# Patient Record
Sex: Male | Born: 1945 | Race: White | Hispanic: No | Marital: Married | Smoking: Never smoker
Health system: Southern US, Community
[De-identification: ages and names within clinical notes are randomized; demographics above are authoritative.]

## PROBLEM LIST (undated history)

## (undated) DIAGNOSIS — D693 Immune thrombocytopenic purpura: Secondary | ICD-10-CM

## (undated) DIAGNOSIS — E079 Disorder of thyroid, unspecified: Secondary | ICD-10-CM

## (undated) HISTORY — DX: Immune thrombocytopenic purpura: D69.3

## (undated) HISTORY — PX: HERNIA REPAIR: SHX51

## (undated) HISTORY — DX: Disorder of thyroid, unspecified: E07.9

---

## 2001-07-28 ENCOUNTER — Emergency Department (HOSPITAL_COMMUNITY): Admission: EM | Admit: 2001-07-28 | Discharge: 2001-07-28 | Payer: Self-pay | Admitting: Emergency Medicine

## 2001-07-28 ENCOUNTER — Encounter: Payer: Self-pay | Admitting: Orthopedic Surgery

## 2001-07-28 ENCOUNTER — Encounter: Payer: Self-pay | Admitting: Emergency Medicine

## 2006-03-18 DIAGNOSIS — D693 Immune thrombocytopenic purpura: Secondary | ICD-10-CM

## 2006-03-18 HISTORY — DX: Immune thrombocytopenic purpura: D69.3

## 2006-03-22 ENCOUNTER — Inpatient Hospital Stay (HOSPITAL_COMMUNITY): Admission: EM | Admit: 2006-03-22 | Discharge: 2006-03-25 | Payer: Self-pay | Admitting: Emergency Medicine

## 2006-03-24 ENCOUNTER — Ambulatory Visit: Payer: Self-pay | Admitting: Oncology

## 2006-03-27 LAB — CBC & DIFF AND RETIC
BASO%: 0.4 % (ref 0.0–2.0)
EOS%: 0.1 % (ref 0.0–7.0)
HGB: 14.9 g/dL (ref 13.0–17.1)
IRF: 0.41 — ABNORMAL HIGH (ref 0.070–0.380)
MCH: 29.7 pg (ref 28.0–33.4)
MCHC: 35 g/dL (ref 32.0–35.9)
MONO#: 0.6 10*3/uL (ref 0.1–0.9)
RDW: 13.9 % (ref 11.2–14.6)
RETIC #: 122.2 10*3/uL — ABNORMAL HIGH (ref 31.8–103.9)
WBC: 8.8 10*3/uL (ref 4.0–10.0)
lymph#: 1.3 10*3/uL (ref 0.9–3.3)

## 2006-03-27 LAB — COMPREHENSIVE METABOLIC PANEL
Albumin: 4.4 g/dL (ref 3.5–5.2)
BUN: 31 mg/dL — ABNORMAL HIGH (ref 6–23)
CO2: 26 mEq/L (ref 19–32)
Calcium: 9 mg/dL (ref 8.4–10.5)
Chloride: 106 mEq/L (ref 96–112)
Glucose, Bld: 93 mg/dL (ref 70–99)
Potassium: 3.7 mEq/L (ref 3.5–5.3)

## 2006-03-30 ENCOUNTER — Encounter (INDEPENDENT_AMBULATORY_CARE_PROVIDER_SITE_OTHER): Payer: Self-pay | Admitting: *Deleted

## 2006-03-30 ENCOUNTER — Encounter: Admission: RE | Admit: 2006-03-30 | Discharge: 2006-03-30 | Payer: Self-pay | Admitting: Internal Medicine

## 2006-03-30 ENCOUNTER — Other Ambulatory Visit: Admission: RE | Admit: 2006-03-30 | Discharge: 2006-03-30 | Payer: Self-pay | Admitting: Interventional Radiology

## 2006-04-03 LAB — CBC WITH DIFFERENTIAL/PLATELET
BASO%: 0.4 % (ref 0.0–2.0)
EOS%: 0.1 % (ref 0.0–7.0)
MCH: 29.8 pg (ref 28.0–33.4)
MCHC: 34.5 g/dL (ref 32.0–35.9)
MCV: 86.4 fL (ref 81.6–98.0)
MONO%: 3.2 % (ref 0.0–13.0)
NEUT#: 9.8 10*3/uL — ABNORMAL HIGH (ref 1.5–6.5)
RBC: 5.1 10*6/uL (ref 4.20–5.71)
RDW: 14.3 % (ref 11.2–14.6)

## 2006-04-03 LAB — MORPHOLOGY

## 2006-04-10 LAB — CBC WITH DIFFERENTIAL/PLATELET
BASO%: 0.6 % (ref 0.0–2.0)
Eosinophils Absolute: 0 10*3/uL (ref 0.0–0.5)
HCT: 41.9 % (ref 38.7–49.9)
LYMPH%: 7.3 % — ABNORMAL LOW (ref 14.0–48.0)
MCHC: 34.8 g/dL (ref 32.0–35.9)
MCV: 86.3 fL (ref 81.6–98.0)
MONO%: 5.6 % (ref 0.0–13.0)
NEUT%: 86.5 % — ABNORMAL HIGH (ref 40.0–75.0)
Platelets: 178 10*3/uL (ref 145–400)
RBC: 4.85 10*6/uL (ref 4.20–5.71)

## 2006-04-10 LAB — MORPHOLOGY: RBC Comments: NORMAL

## 2006-05-01 LAB — CBC WITH DIFFERENTIAL/PLATELET
BASO%: 0.3 % (ref 0.0–2.0)
EOS%: 0.1 % (ref 0.0–7.0)
HCT: 40.7 % (ref 38.7–49.9)
MCH: 29.8 pg (ref 28.0–33.4)
MCHC: 34.3 g/dL (ref 32.0–35.9)
NEUT%: 78 % — ABNORMAL HIGH (ref 40.0–75.0)
RBC: 4.68 10*6/uL (ref 4.20–5.71)
WBC: 7 10*3/uL (ref 4.0–10.0)
lymph#: 1.1 10*3/uL (ref 0.9–3.3)

## 2006-05-11 ENCOUNTER — Ambulatory Visit: Payer: Self-pay | Admitting: Oncology

## 2006-05-22 LAB — CBC WITH DIFFERENTIAL/PLATELET
BASO%: 0.4 % (ref 0.0–2.0)
EOS%: 0.9 % (ref 0.0–7.0)
HCT: 41.4 % (ref 38.7–49.9)
HGB: 14.2 g/dL (ref 13.0–17.1)
MCHC: 34.3 g/dL (ref 32.0–35.9)
MONO#: 0.4 10*3/uL (ref 0.1–0.9)
NEUT%: 61 % (ref 40.0–75.0)
RDW: 14.6 % (ref 11.2–14.6)
WBC: 5.3 10*3/uL (ref 4.0–10.0)
lymph#: 1.6 10*3/uL (ref 0.9–3.3)

## 2006-05-29 LAB — CBC WITH DIFFERENTIAL/PLATELET
Basophils Absolute: 0 10*3/uL (ref 0.0–0.1)
Eosinophils Absolute: 0.2 10*3/uL (ref 0.0–0.5)
HCT: 40.2 % (ref 38.7–49.9)
HGB: 13.9 g/dL (ref 13.0–17.1)
MCH: 29.9 pg (ref 28.0–33.4)
MONO#: 0.7 10*3/uL (ref 0.1–0.9)
NEUT#: 4.4 10*3/uL (ref 1.5–6.5)
NEUT%: 63.4 % (ref 40.0–75.0)
RDW: 14.3 % (ref 11.2–14.6)
WBC: 7 10*3/uL (ref 4.0–10.0)
lymph#: 1.7 10*3/uL (ref 0.9–3.3)

## 2006-06-05 LAB — CBC WITH DIFFERENTIAL/PLATELET
Basophils Absolute: 0 10*3/uL (ref 0.0–0.1)
EOS%: 1.8 % (ref 0.0–7.0)
Eosinophils Absolute: 0.1 10*3/uL (ref 0.0–0.5)
HCT: 40.4 % (ref 38.7–49.9)
HGB: 13.9 g/dL (ref 13.0–17.1)
MCH: 29.4 pg (ref 28.0–33.4)
MCV: 85.3 fL (ref 81.6–98.0)
MONO%: 7.2 % (ref 0.0–13.0)
NEUT#: 3.4 10*3/uL (ref 1.5–6.5)
NEUT%: 61.3 % (ref 40.0–75.0)
Platelets: 222 10*3/uL (ref 145–400)
RDW: 14.1 % (ref 11.2–14.6)

## 2006-06-05 LAB — MORPHOLOGY: PLT EST: ADEQUATE

## 2006-06-12 LAB — CBC WITH DIFFERENTIAL/PLATELET
Basophils Absolute: 0 10*3/uL (ref 0.0–0.1)
Eosinophils Absolute: 0.1 10*3/uL (ref 0.0–0.5)
HCT: 42.9 % (ref 38.7–49.9)
HGB: 14.6 g/dL (ref 13.0–17.1)
MCH: 29.3 pg (ref 28.0–33.4)
MCV: 86.4 fL (ref 81.6–98.0)
MONO%: 6.9 % (ref 0.0–13.0)
NEUT#: 3.6 10*3/uL (ref 1.5–6.5)
NEUT%: 63 % (ref 40.0–75.0)
RDW: 14.4 % (ref 11.2–14.6)
lymph#: 1.5 10*3/uL (ref 0.9–3.3)

## 2006-06-12 LAB — MORPHOLOGY
PLT EST: ADEQUATE
RBC Comments: NORMAL

## 2006-06-26 ENCOUNTER — Ambulatory Visit: Payer: Self-pay | Admitting: Oncology

## 2006-06-27 LAB — CBC WITH DIFFERENTIAL/PLATELET
Basophils Absolute: 0 10*3/uL (ref 0.0–0.1)
Eosinophils Absolute: 0.2 10*3/uL (ref 0.0–0.5)
HCT: 39.1 % (ref 38.7–49.9)
HGB: 13.5 g/dL (ref 13.0–17.1)
MONO#: 0.4 10*3/uL (ref 0.1–0.9)
NEUT#: 2.7 10*3/uL (ref 1.5–6.5)
NEUT%: 54.1 % (ref 40.0–75.0)
WBC: 4.9 10*3/uL (ref 4.0–10.0)
lymph#: 1.7 10*3/uL (ref 0.9–3.3)

## 2006-06-27 LAB — MORPHOLOGY: PLT EST: ADEQUATE

## 2006-07-14 LAB — CBC WITH DIFFERENTIAL/PLATELET
BASO%: 0.5 % (ref 0.0–2.0)
Basophils Absolute: 0 10*3/uL (ref 0.0–0.1)
HCT: 42.2 % (ref 38.7–49.9)
HGB: 14.5 g/dL (ref 13.0–17.1)
MONO#: 0.4 10*3/uL (ref 0.1–0.9)
NEUT%: 59.2 % (ref 40.0–75.0)
RDW: 13.3 % (ref 11.2–14.6)
WBC: 5.9 10*3/uL (ref 4.0–10.0)
lymph#: 1.8 10*3/uL (ref 0.9–3.3)

## 2006-07-14 LAB — MORPHOLOGY: PLT EST: ADEQUATE

## 2006-08-23 ENCOUNTER — Ambulatory Visit: Payer: Self-pay | Admitting: Oncology

## 2006-10-03 LAB — CBC WITH DIFFERENTIAL/PLATELET
Eosinophils Absolute: 0.2 10*3/uL (ref 0.0–0.5)
HCT: 43 % (ref 38.7–49.9)
LYMPH%: 31.1 % (ref 14.0–48.0)
MCV: 82 fL (ref 81.6–98.0)
MONO#: 0.4 10*3/uL (ref 0.1–0.9)
MONO%: 8.2 % (ref 0.0–13.0)
NEUT#: 3.1 10*3/uL (ref 1.5–6.5)
NEUT%: 57.1 % (ref 40.0–75.0)
Platelets: 183 10*3/uL (ref 145–400)
RBC: 5.24 10*6/uL (ref 4.20–5.71)

## 2006-10-23 ENCOUNTER — Ambulatory Visit: Payer: Self-pay | Admitting: Oncology

## 2006-10-26 LAB — MORPHOLOGY

## 2006-10-26 LAB — CBC WITH DIFFERENTIAL/PLATELET
EOS%: 4.7 % (ref 0.0–7.0)
MCH: 29.4 pg (ref 28.0–33.4)
MCHC: 35.9 g/dL (ref 32.0–35.9)
MCV: 82 fL (ref 81.6–98.0)
MONO%: 8.1 % (ref 0.0–13.0)
NEUT#: 2.6 10*3/uL (ref 1.5–6.5)
RBC: 4.66 10*6/uL (ref 4.20–5.71)
RDW: 14.2 % (ref 11.2–14.6)

## 2006-11-30 LAB — CBC WITH DIFFERENTIAL/PLATELET
BASO%: 0.4 % (ref 0.0–2.0)
Eosinophils Absolute: 0.2 10*3/uL (ref 0.0–0.5)
LYMPH%: 34.3 % (ref 14.0–48.0)
MCHC: 35.5 g/dL (ref 32.0–35.9)
MCV: 82.9 fL (ref 81.6–98.0)
MONO%: 7 % (ref 0.0–13.0)
NEUT%: 54.6 % (ref 40.0–75.0)
Platelets: 175 10*3/uL (ref 145–400)
RBC: 4.57 10*6/uL (ref 4.20–5.71)

## 2006-11-30 LAB — MORPHOLOGY: RBC Comments: NORMAL

## 2006-12-26 ENCOUNTER — Ambulatory Visit: Payer: Self-pay | Admitting: Oncology

## 2006-12-28 LAB — CBC WITH DIFFERENTIAL/PLATELET
BASO%: 0.7 % (ref 0.0–2.0)
Basophils Absolute: 0 10*3/uL (ref 0.0–0.1)
HCT: 32.5 % — ABNORMAL LOW (ref 38.7–49.9)
HGB: 11.4 g/dL — ABNORMAL LOW (ref 13.0–17.1)
LYMPH%: 30.5 % (ref 14.0–48.0)
MCHC: 35 g/dL (ref 32.0–35.9)
MONO#: 0.3 10*3/uL (ref 0.1–0.9)
NEUT%: 58.6 % (ref 40.0–75.0)
Platelets: 221 10*3/uL (ref 145–400)
WBC: 4.7 10*3/uL (ref 4.0–10.0)

## 2006-12-28 LAB — MORPHOLOGY: PLT EST: ADEQUATE

## 2007-02-20 ENCOUNTER — Ambulatory Visit: Payer: Self-pay | Admitting: Oncology

## 2007-02-20 LAB — CBC WITH DIFFERENTIAL/PLATELET
Basophils Absolute: 0 10*3/uL (ref 0.0–0.1)
Eosinophils Absolute: 0.2 10*3/uL (ref 0.0–0.5)
HGB: 12.7 g/dL — ABNORMAL LOW (ref 13.0–17.1)
MCV: 78.4 fL — ABNORMAL LOW (ref 81.6–98.0)
MONO#: 0.4 10*3/uL (ref 0.1–0.9)
NEUT#: 2.6 10*3/uL (ref 1.5–6.5)
RBC: 4.67 10*6/uL (ref 4.20–5.71)
RDW: 14 % (ref 11.2–14.6)
WBC: 5 10*3/uL (ref 4.0–10.0)
lymph#: 1.7 10*3/uL (ref 0.9–3.3)

## 2007-02-20 LAB — MORPHOLOGY: PLT EST: ADEQUATE

## 2007-03-28 LAB — COMPREHENSIVE METABOLIC PANEL
ALT: 16 U/L (ref 0–53)
AST: 15 U/L (ref 0–37)
Albumin: 4.3 g/dL (ref 3.5–5.2)
Alkaline Phosphatase: 65 U/L (ref 39–117)
BUN: 21 mg/dL (ref 6–23)
Calcium: 8.9 mg/dL (ref 8.4–10.5)
Chloride: 108 mEq/L (ref 96–112)
Potassium: 4.3 mEq/L (ref 3.5–5.3)

## 2007-03-28 LAB — CBC WITH DIFFERENTIAL/PLATELET
BASO%: 0.6 % (ref 0.0–2.0)
Eosinophils Absolute: 0.1 10*3/uL (ref 0.0–0.5)
LYMPH%: 32.1 % (ref 14.0–48.0)
MCHC: 35.3 g/dL (ref 32.0–35.9)
MONO#: 0.4 10*3/uL (ref 0.1–0.9)
NEUT#: 3.2 10*3/uL (ref 1.5–6.5)
Platelets: 190 10*3/uL (ref 145–400)
RBC: 4.89 10*6/uL (ref 4.20–5.71)
RDW: 16.3 % — ABNORMAL HIGH (ref 11.2–14.6)
WBC: 5.5 10*3/uL (ref 4.0–10.0)

## 2007-03-28 LAB — MORPHOLOGY: PLT EST: ADEQUATE

## 2007-03-28 LAB — CHCC SMEAR

## 2007-06-25 ENCOUNTER — Ambulatory Visit: Payer: Self-pay | Admitting: Oncology

## 2007-09-17 ENCOUNTER — Ambulatory Visit: Payer: Self-pay | Admitting: Oncology

## 2007-10-15 LAB — COMPREHENSIVE METABOLIC PANEL
AST: 18 U/L (ref 0–37)
BUN: 19 mg/dL (ref 6–23)
CO2: 25 mEq/L (ref 19–32)
Calcium: 9.2 mg/dL (ref 8.4–10.5)
Chloride: 111 mEq/L (ref 96–112)
Creatinine, Ser: 1.27 mg/dL (ref 0.40–1.50)
Glucose, Bld: 96 mg/dL (ref 70–99)

## 2007-10-15 LAB — CHCC SMEAR

## 2007-10-15 LAB — CBC WITH DIFFERENTIAL/PLATELET
Basophils Absolute: 0 10*3/uL (ref 0.0–0.1)
EOS%: 3.6 % (ref 0.0–7.0)
HGB: 14.2 g/dL (ref 13.0–17.1)
MCH: 29.7 pg (ref 28.0–33.4)
NEUT#: 3.1 10*3/uL (ref 1.5–6.5)
RDW: 14.2 % (ref 11.2–14.6)
lymph#: 1.6 10*3/uL (ref 0.9–3.3)

## 2007-10-15 LAB — MORPHOLOGY

## 2007-12-11 ENCOUNTER — Ambulatory Visit: Payer: Self-pay | Admitting: Oncology

## 2007-12-13 LAB — CBC WITH DIFFERENTIAL/PLATELET
BASO%: 0.7 % (ref 0.0–2.0)
EOS%: 4.9 % (ref 0.0–7.0)
MCH: 29 pg (ref 28.0–33.4)
MCHC: 34.6 g/dL (ref 32.0–35.9)
RBC: 4.77 10*6/uL (ref 4.20–5.71)
RDW: 13.7 % (ref 11.2–14.6)
lymph#: 1.7 10*3/uL (ref 0.9–3.3)

## 2008-02-05 ENCOUNTER — Ambulatory Visit: Payer: Self-pay | Admitting: Oncology

## 2008-02-07 LAB — CBC WITH DIFFERENTIAL/PLATELET
BASO%: 0.6 % (ref 0.0–2.0)
EOS%: 5.3 % (ref 0.0–7.0)
HCT: 39.9 % (ref 38.7–49.9)
LYMPH%: 32.7 % (ref 14.0–48.0)
MCH: 29.1 pg (ref 28.0–33.4)
MCHC: 34.6 g/dL (ref 32.0–35.9)
NEUT%: 53.7 % (ref 40.0–75.0)
Platelets: 161 10*3/uL (ref 145–400)

## 2008-04-01 ENCOUNTER — Ambulatory Visit: Payer: Self-pay | Admitting: Oncology

## 2008-04-18 LAB — COMPREHENSIVE METABOLIC PANEL
ALT: 19 U/L (ref 0–53)
Albumin: 4.5 g/dL (ref 3.5–5.2)
Alkaline Phosphatase: 65 U/L (ref 39–117)
CO2: 24 mEq/L (ref 19–32)
Potassium: 4.5 mEq/L (ref 3.5–5.3)
Sodium: 140 mEq/L (ref 135–145)
Total Bilirubin: 0.9 mg/dL (ref 0.3–1.2)
Total Protein: 7.1 g/dL (ref 6.0–8.3)

## 2008-04-18 LAB — CBC & DIFF AND RETIC
BASO%: 1.1 % (ref 0.0–2.0)
Eosinophils Absolute: 0.3 10*3/uL (ref 0.0–0.5)
HCT: 44.8 % (ref 38.7–49.9)
HGB: 15.4 g/dL (ref 13.0–17.1)
IRF: 0.29 (ref 0.070–0.380)
MCHC: 34.4 g/dL (ref 32.0–35.9)
MONO#: 0.4 10*3/uL (ref 0.1–0.9)
NEUT#: 3.3 10*3/uL (ref 1.5–6.5)
NEUT%: 62.6 % (ref 40.0–75.0)
Platelets: 147 10*3/uL (ref 145–400)
WBC: 5.2 10*3/uL (ref 4.0–10.0)
lymph#: 1.2 10*3/uL (ref 0.9–3.3)

## 2008-04-18 LAB — CHCC SMEAR

## 2008-04-18 LAB — LACTATE DEHYDROGENASE: LDH: 167 U/L (ref 94–250)

## 2008-08-30 IMAGING — CR DG CHEST 1V SAME DAY
1 series · 1 of 1 positions shown · non-contrast
Comparison: Earlier the same day.

CLINICAL DATA: Chest pain.  Question nodule vs nipple. 
 PORTABLE CHEST - 1 VIEW ? 03/22/06:

[view not recorded]
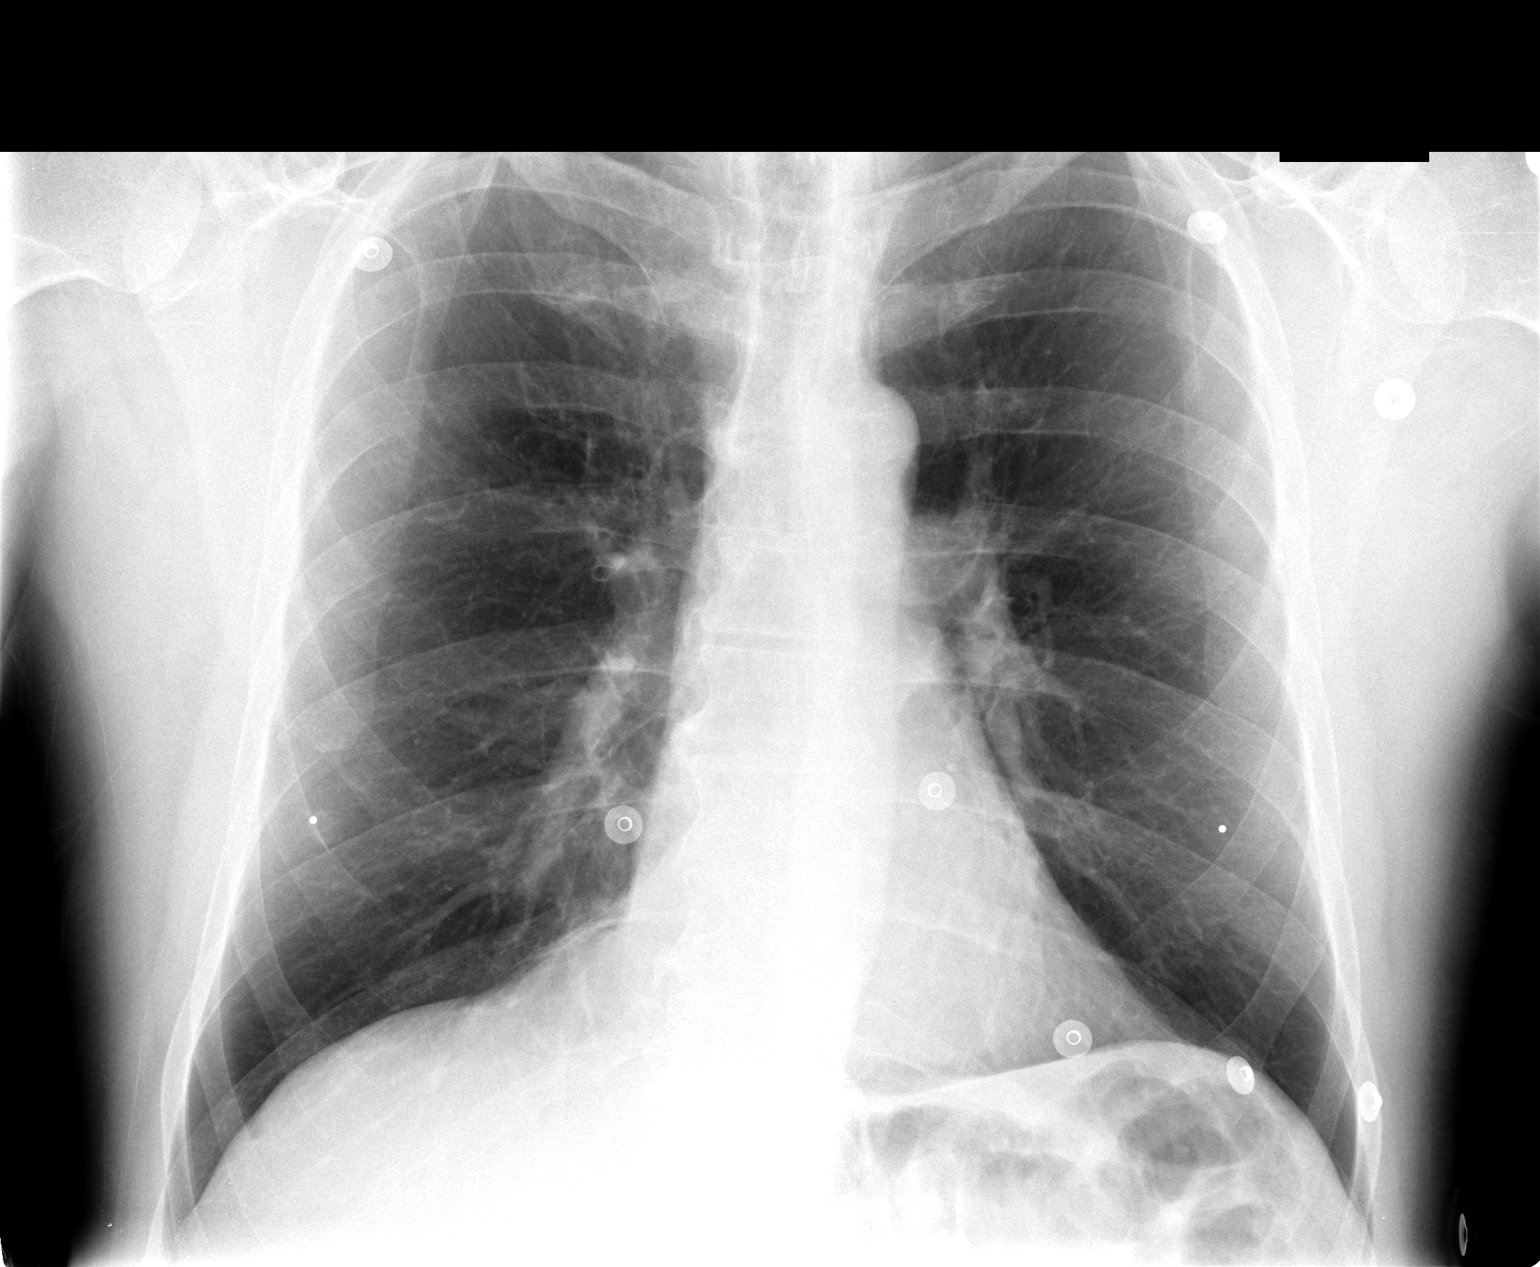

[1 of 1 positions shown; findings below may reference images not displayed]

FINDINGS: Repeat chest radiograph with nipple markers in place demonstrates nodular opacity in the right lower chest as the patient?s nipple.   The lungs are clear.  The chest is hyperexpanded.  Heart size upper normal.
IMPRESSION: Negative for pulmonary nodule in patient with evidence of emphysema.

## 2008-08-30 IMAGING — CR DG CHEST 2V
2 series · 2 of 2 positions shown · non-contrast
Comparison: None.

CLINICAL DATA: CHEST - 2 VIEW:

[view not recorded (1 of 2)]
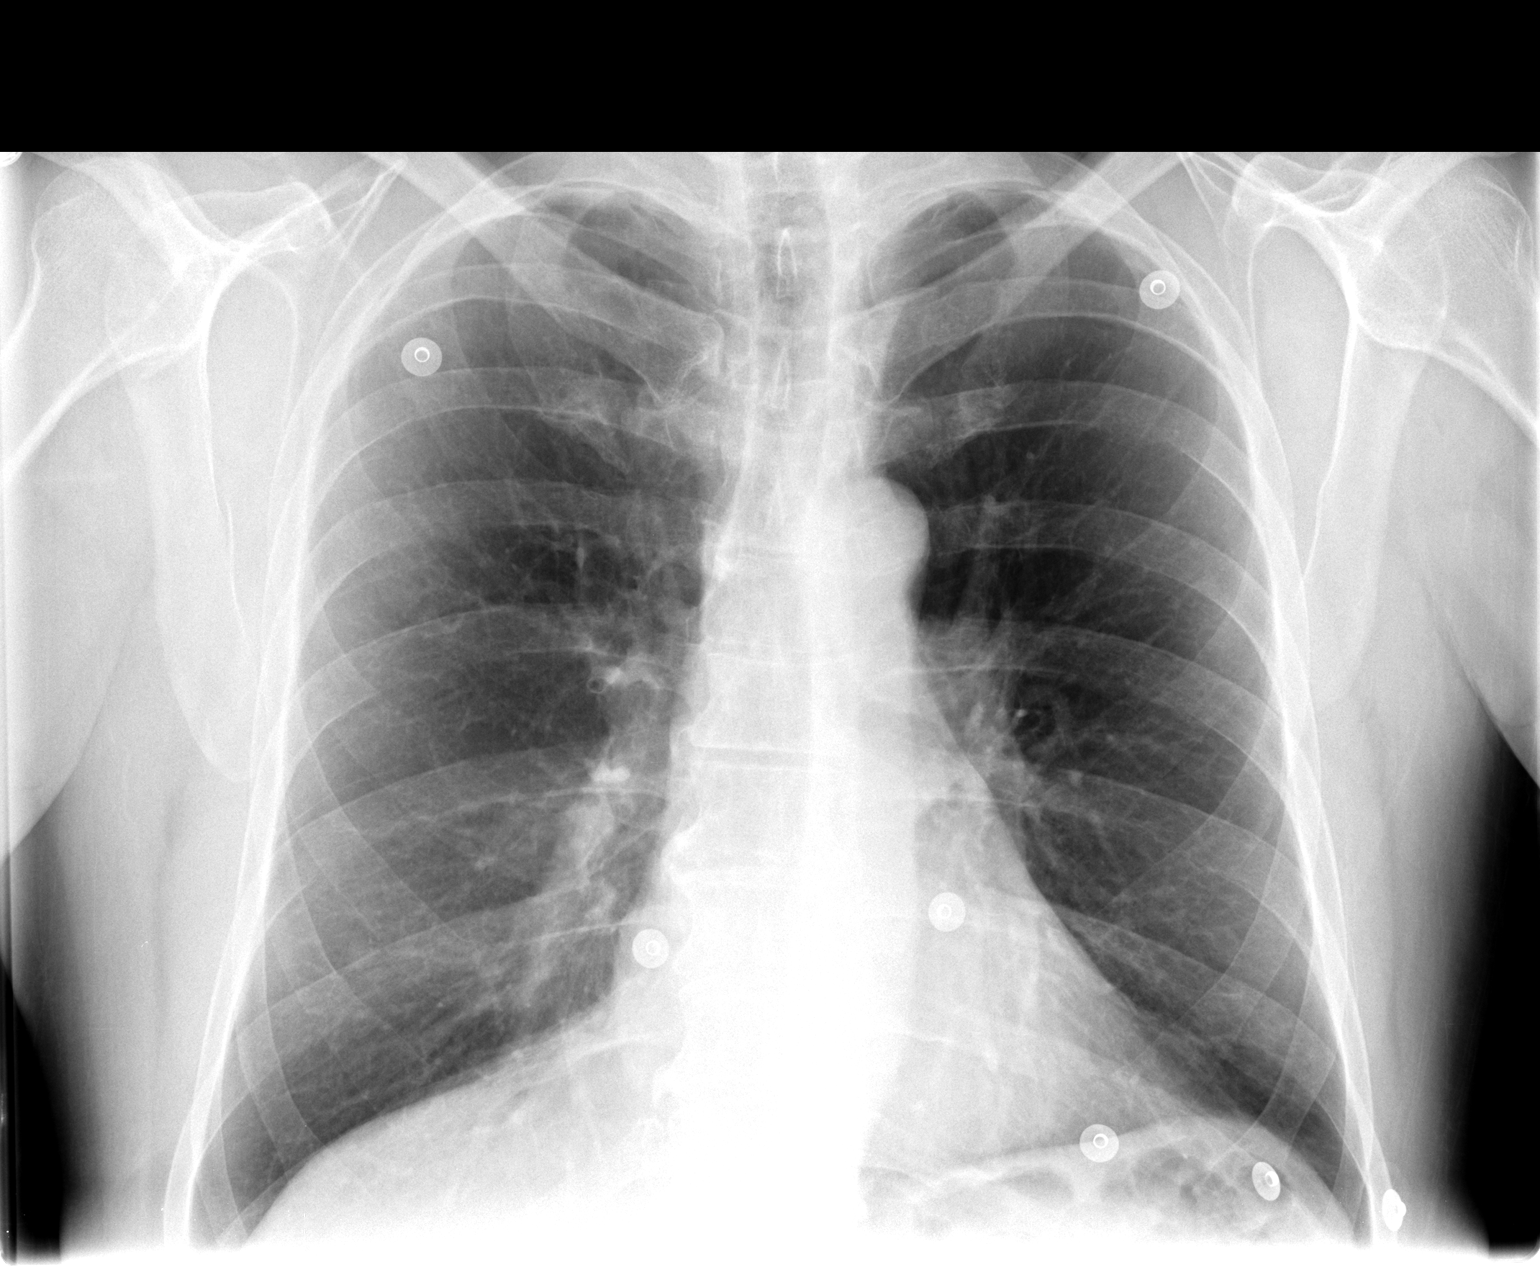

[view not recorded (2 of 2)]
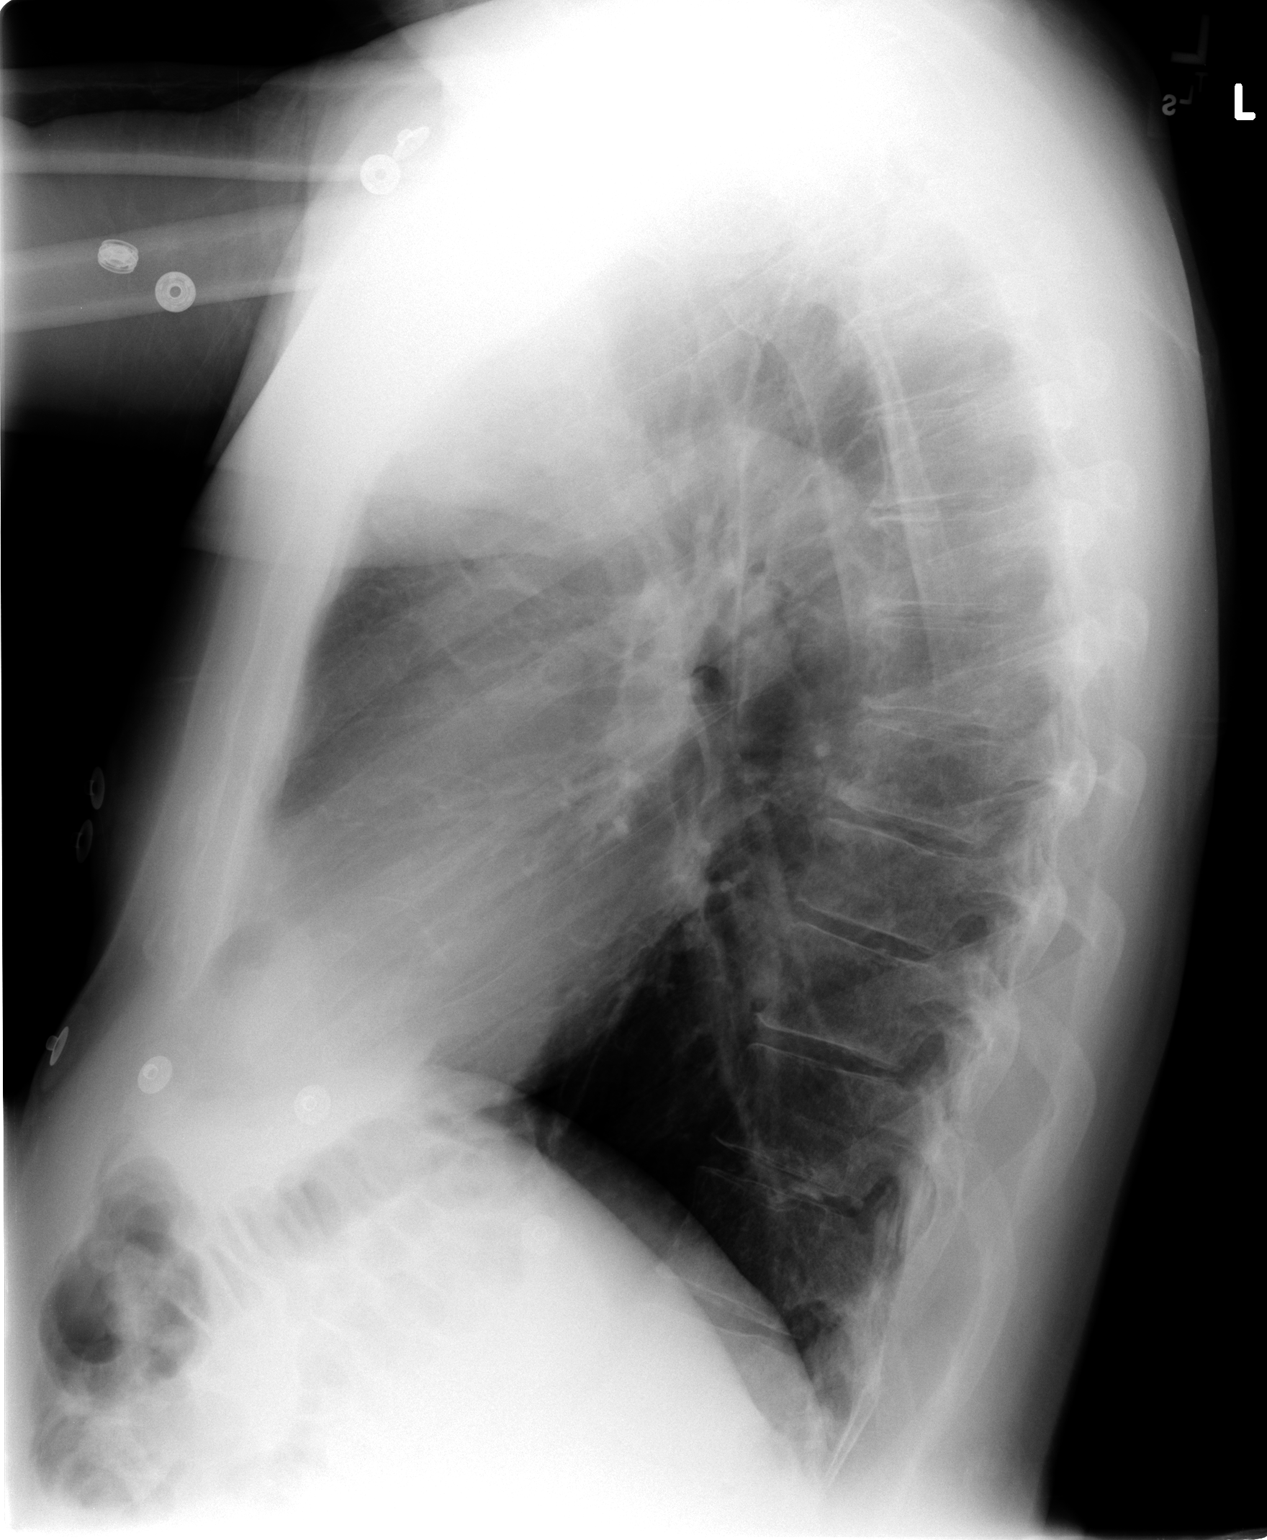

[2 of 2 positions shown; findings below may reference images not displayed]

FINDINGS: The chest appears hyperexpanded but clear.  Heart size is normal.  No effusion.  Clips are noted at the base of the right neck compatible with prior thyroid surgery.  No focal bony abnormality.
IMPRESSION: 1.  No acute disease. 
2.  Hyperexpansion suggesting emphysema.

## 2008-09-07 IMAGING — US US BIOPSY
1 series · 7 of 7 positions shown · non-contrast
Comparison: none

CLINICAL DATA: Previous right thyroidectomy. Left   nodule with
microcalcifications.

ULTRASOUND GUIDED ASPIRATION BIOPSY THYROID:
TECHNIQUE: Overlying skin prepped with Betadine, draped in usual sterile
fashion, and infiltrated locally with 1% lidocaine. Under real-time ultrasound
guidance, 4 passes were made into the dominant left lower pole nodule using 25
gauge needles. Samples were given to cytopathology. No immediate complication.

[Series 1: unknown · 0.07mm/px · 7 of 7 slices shown]
[im 1/7]
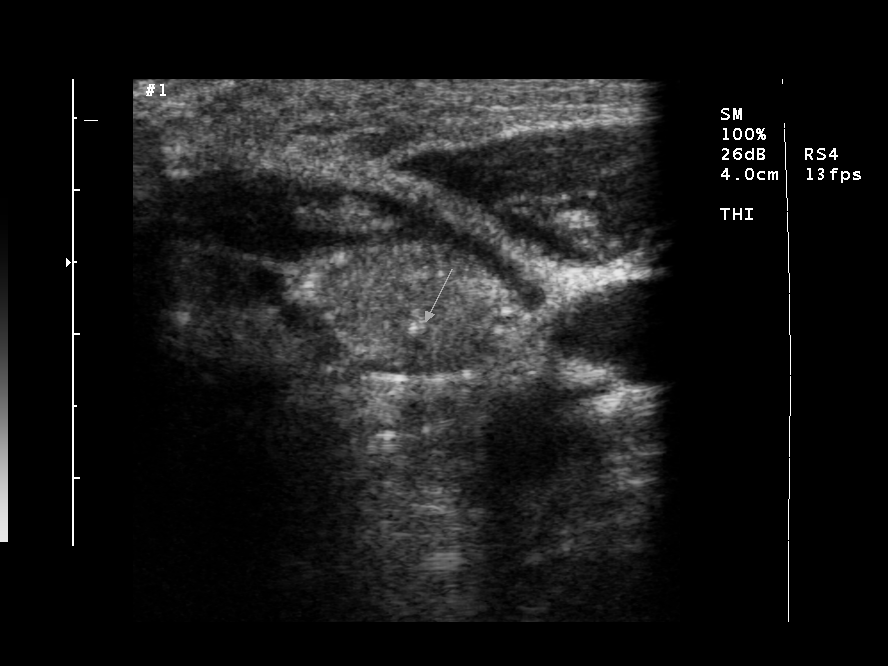
[im 2/7]
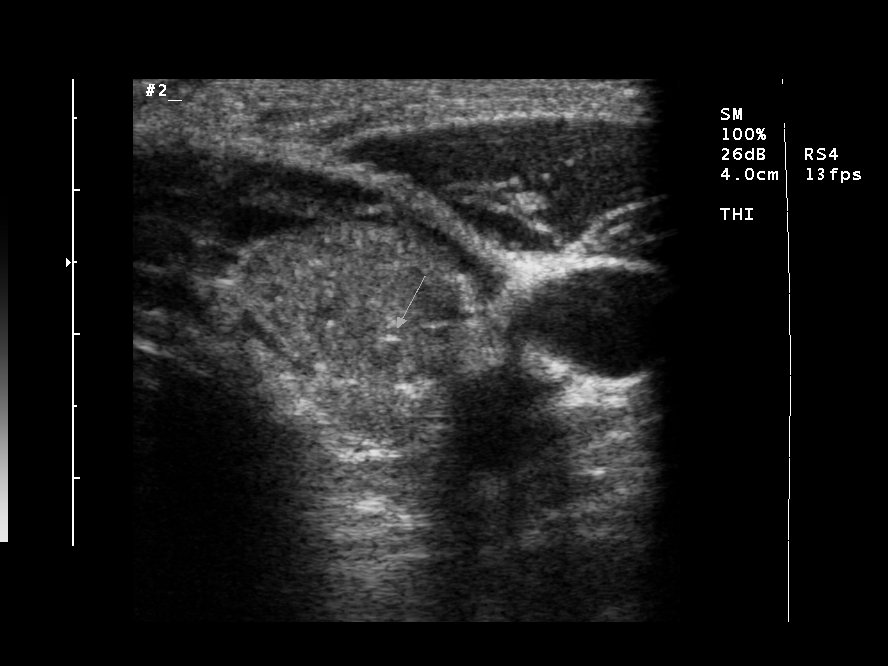
[im 3/7]
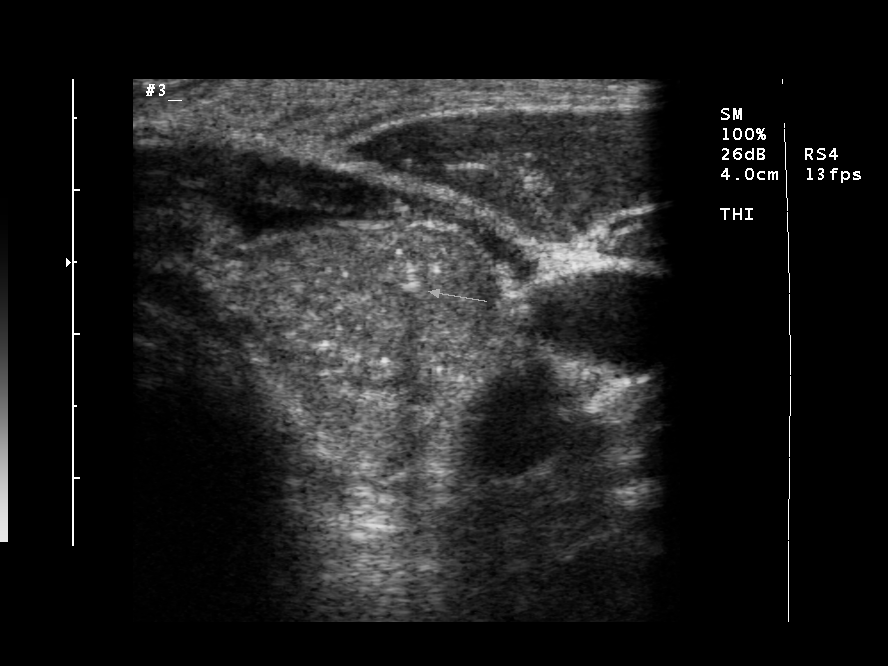
[im 4/7]
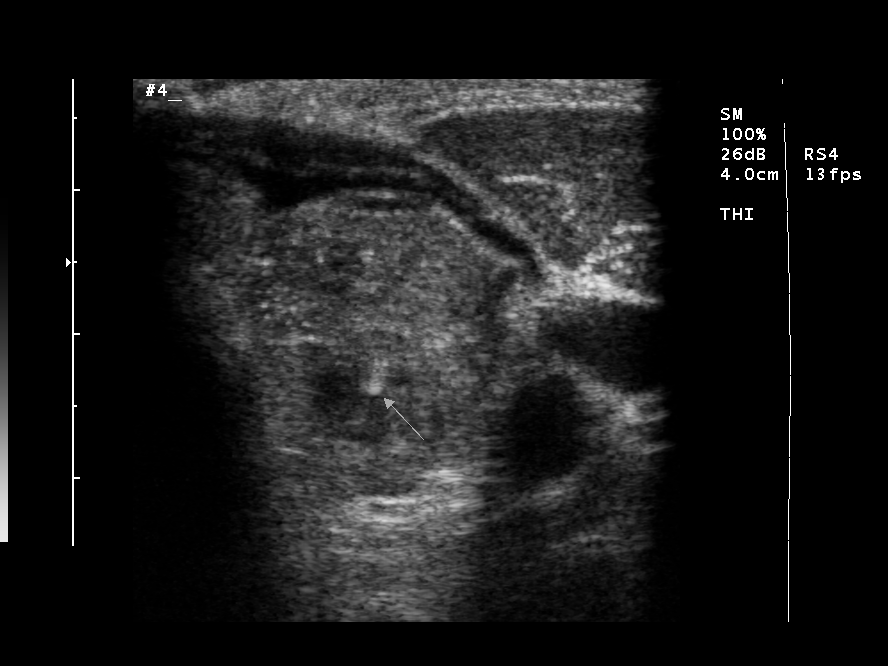
[im 5/7]
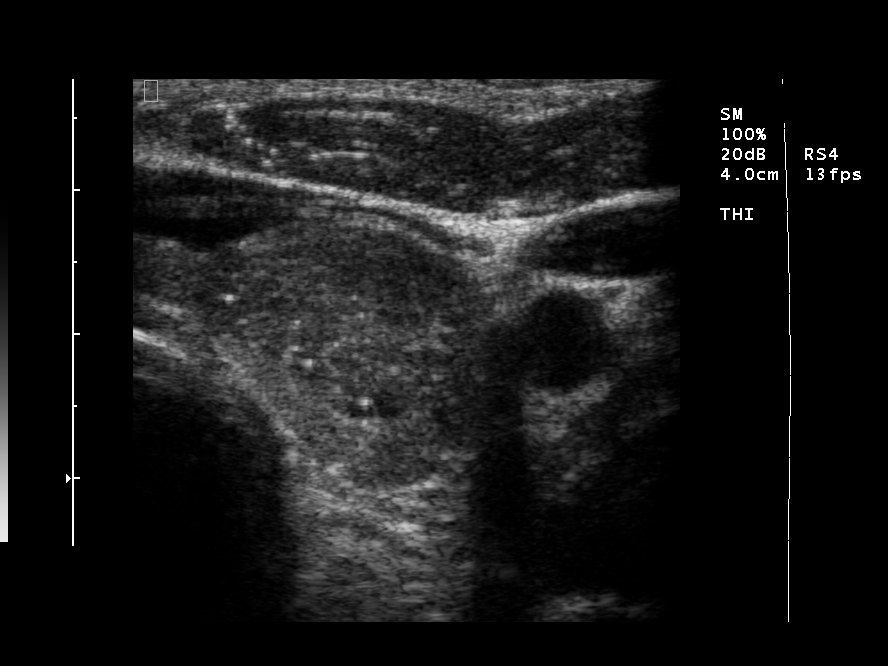
[im 6/7]
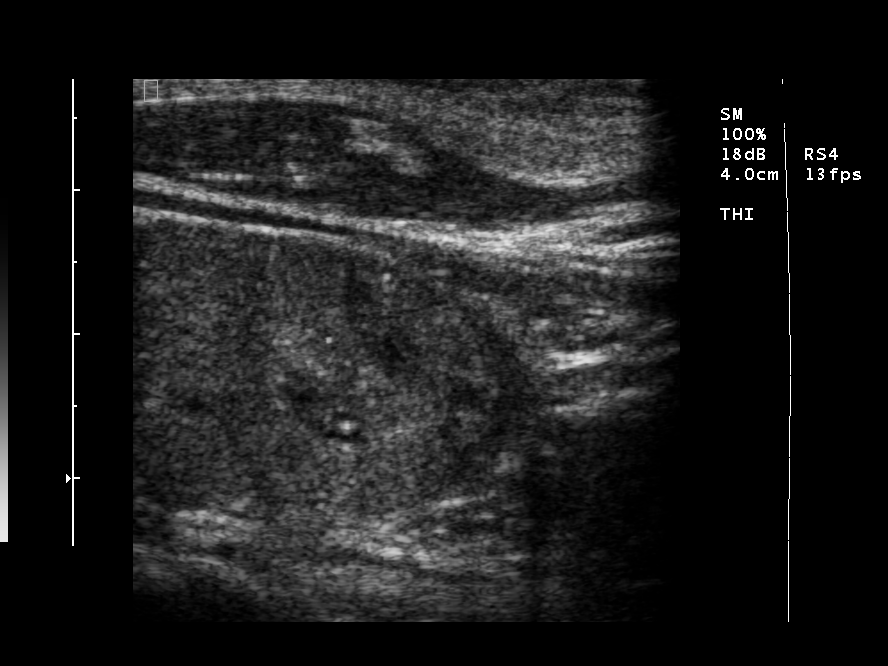
[im 7/7]
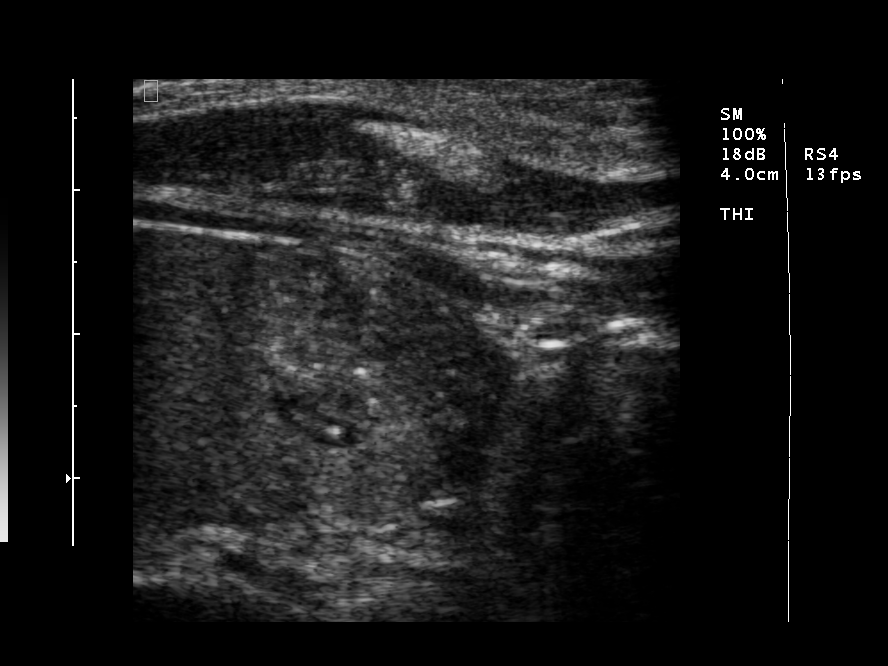

[7 of 7 positions shown; findings below may reference images not displayed]

IMPRESSION: Technically successful ultrasound guided FNA thyroid biopsy.

## 2009-11-24 ENCOUNTER — Encounter: Admission: RE | Admit: 2009-11-24 | Discharge: 2009-11-24 | Payer: Self-pay | Admitting: Internal Medicine

## 2010-12-03 NOTE — Consult Note (Signed)
NAME:  Derek Pham, Derek Pham NO.:  [id number]   MEDICAL RECORD NO.:  [id number]          PATIENT TYPE:  INP   LOCATION:  5030                         FACILITY:  [facility]   PHYSICIAN:  Derek Pham, M.D.      DATE OF BIRTH:  Mar 27, 1946   DATE OF CONSULTATION:  03/23/2006  DATE OF DISCHARGE:  03/25/2006                                   CONSULTATION   REASON FOR CONSULTATION:  Idiopathic thrombocytopenic purpura.   HISTORY OF PRESENT ILLNESS:  Derek Pham is a 65 year old white male, asked  to see for evaluation of ITP. The patient presented with a new onset of  moist purpura and some hemorrhoidal bleeding, as well as lower extremity  petechiae. He was seen at the walk in clinic and was found to have a  platelet count of less than 5,000. Coagulations were normal.  LFTs were  essentially normal with the exception of a bilirubin of 1.3. Steroids were  initiated at 80 mg IV q.6 h. of Solu-Medrol and was also given 1 unit of  platelets. The platelet count increased from less than 5 to 39,000 in 24  hours. No further bleeding. He feels otherwise well. He had no fevers or  night sweats. His weight had been stable. Performance status has been  excellent. A CT of the abdomen and pelvis with contrast on March 23, 2006  was negative for hepatosplenomegaly or masses or free fluid. No heparin or  NSAIDs were received. We were asked to see the patient with recommendations  regarding his care.   PAST MEDICAL HISTORY:  1. Prior history of __________, appears to be thyroid cancer, resected      about 25 years ago or greater, at [facility]. He has not had      recurrence.  2. Status post reduction of anterior dislocated humeral head in January of      2003.   ALLERGIES:  PENICILLIN.   CURRENT MEDICATIONS:  1. Colace 100 mg b.i.d.  2. Solu-Medrol 80 mg q.6 hours IV.  3. Protonix 40 mg b.i.d., started on March 22, 2006.  4. Ventolin and Atrovent nebulizer.  5. Tylenol and  Tux p.r.n.  Of note, the patient was on medications or vitamins prior to admission.   REVIEW OF SYSTEMS:  See HPI for significant positives.  No NSAIDs or  gingival or nose bleeds. No viral exposure or tick exposure. The rest of the  review of systems is negative.   FAMILY HISTORY:  Mother died with bladder cancer. Father died with  emphysema. One brother died with epilepsy and one brother is alive and well.   SOCIAL HISTORY:  The patient is married. He has 1 daughter in good health.  He is an Art gallery manager by degree. No alcohol or tobacco history. He lives in  [place].   PAST FAMILY PSYCHIATRIC HISTORY:  GENERAL:  A well-developed, well-nourished  65 year old white male in no acute distress. Alert and oriented times three.  VITAL SIGNS:  Blood pressure 119/68, pulse 64, respiratory rate 20,  temperature 97.9. Pulse oximetry 97 on room air. Weight 206 pounds. Height  75 inches.  HEENT:  Normocephalic and atraumatic. Slightly left disconjugation of the  extraocular muscle. Mouth demonstrating some petechiae of the tongue, and he  also shows blisters at the lips, which are resolving.  NECK:  Supple. No cervical or supraclavicular masses. Cannot palpate the  known thyroid nodule.  LUNGS:  Clear to auscultation bilaterally. Shotty lymphadenopathy, more  pronounced on the left.  CARDIOVASCULAR:  Regular rate and rhythm. Without murmur, rub, or gallop.  ABDOMEN:  Soft, nontender. Bowel sounds present. No palpable spleen or  liver.  GENITOURINARY/RECTAL:  Deferred.  EXTREMITIES:  No clubbing, cyanosis, or edema.  SKIN:  Showing bilateral petechial rash in the lower extremities.  NEUROLOGIC:  Non-focal.   LABORATORY DATA:  Hemoglobin and hematocrit 14.1 and 40.2. White count 10.4.  Platelets 39,000. Neutrophils 9.9, MCV 85.4, PT 14.1, PTT 28, INR 1.1.  Sodium 140, potassium 3.8, BUN 16, creatinine 1.4, glucose 101, total  bilirubin 1.3, alkaline phosphatase 64, AST 24, ALT 25, total  protein 7,  albumin 4, calcium 9.5. TSH, T4, and T3 are currently pending.   He states his blood smears are otherwise unremarkable, apart from the  absence of platelets.   RADIOLOGIC STUDIES:  Shows a 9 mm left thyroid nodule, __________ right  axillary nodes, none meeting the criteria for pathologic enlargement, no  mediastinal or hilar lymphadenopathy. No effusion.   Abdominal CT with no hepatosplenomegaly, or free fluid. Bilateral inguinal  hernias, right greater than left. No acute disease.   IMPRESSION/RECOMMENDATIONS:  Dr. Donnie Pham has seen and evaluated the patient  and the chart has been reviewed. Please refer to the dictation [phone], for  the assessment and plan by Dr. Donnie Pham. This is a 66 year old male who  presented with thrombocytopenia, with otherwise, normal hemoglobin and  normal WBCs. This is likely ITP. Continue with steroids. The patient could  be discharged on p.o. prednisone at 1 mg p.o. kilogram with low tapering.  Will see the patient in the office as an outpatient for further evaluation.  If the platelets continue to be low, the patient may then receive some IVIG.  Possible bone marrow to continue the diagnosis. No platelet transfusions are  recommended.   Thank you very much for allowing [place] the opportunity to participate in the  care of Mr. Derek Pham.      Derek Pham, P.A.      Derek Pham, M.D.  Electronically Signed    SW/MEDQ  D:  03/27/2006  T:  03/27/2006  Job:  [id number]   cc:   Derek Pham, M.D.

## 2010-12-03 NOTE — H&P (Signed)
NAME:  Derek Pham, Derek Pham            ACCOUNT NO.:  [id number]   MEDICAL RECORD NO.:  [id number]          PATIENT TYPE:  EMS   LOCATION:  MAJO                         FACILITY:  [facility]   PHYSICIAN:  Melissa L. Ladona Ridgel, MD  DATE OF BIRTH:  [birth year]   DATE OF ADMISSION:  03/22/2006  DATE OF DISCHARGE:                                HISTORY & PHYSICAL   CHIEF COMPLAINT:  Blood blisters on his tongue and rectal bleeding.   HISTORY OF PRESENT ILLNESS:  The patient is a 65 year old white male who  noticed blood blisters on his tongue and lip over the last 24-48 hours.  He  states his last visit to a physician was 20 years ago and he has not had any  medical problems in that time period.  But, on Saturday, he noticed that  after he scraped his fingers, they would not stop bleeding and now today,  noticed that these blood blisters came up spontaneously.  The patient also  noticed today that he had spots on his legs and had some hemorrhoidal  bleeding which was fairly extensive.  The patient denies any gingival  bleeding or any nosebleeds.   REVIEW OF SYSTEMS:  He denies weight loss or weight gain.  He has had no  fevers or chills.  No nausea and vomiting.  He did have rectal bleeding  which he has had before off and on with  his hemorrhoids.  He denies chest  pain, abdominal pain, back pain.  He is not short of breath.  He denies  dysuria.  He denies hematuria.  All other review of systems appears to be  negative.   PAST MEDICAL HISTORY:  He had a dislocated shoulder in the past.  He had a  thyroid tumor removed 20 some odd years ago and five years out still was  cancer free and, therefore, did not continue to follow with anybody, he was  on medication briefly but did not require anything thereafter.   PAST SURGICAL HISTORY:  Thyroid operations in 1973 to 1974.   SOCIAL HISTORY:  He denies tobacco.  He does have one beer a month.  He is  an Art gallery manager by trade.   FAMILY HISTORY:  Mom is  deceased, she had bladder cancer.  Dad is deceased  with emphysema.  He had one brother who is deceased from epilepsy and one  brother who is living without medical illness.  He has one daughter.   ALLERGIES:  PENICILLIN.   MEDICATIONS:  None.  He takes no herbal medications or vitamins.  He is not  taking aspirin or any other anti-platelet agents.   PHYSICAL EXAMINATION:  VITAL SIGNS:  Temperature 97.9, blood pressure 132/77, pulse 77,  respirations 18, saturation 97%.  GENERAL:  He is no acute distress but he does have strabismus bilaterally.  HEENT:  Mucous membranes are moist with wet purpura on the tongue and lip.  He has no soft palate involvement at this time.  NECK:  Supple.  There is a well healed thyroid scar with no thyromegaly.  CHEST:  Clear to auscultation, he has occasional petechiae on  his back and  chest.  CARDIOVASCULAR:  Regular rate and rhythm, positive S1 and S2, no S3 or S4,  no murmurs, gallops, and rubs.  ABDOMEN:  Soft, nontender, nondistended, positive bowel sounds.  RECTAL:  External rectal examination reveals one hemorrhoid that is not  actively bleeding and there is no obvious blood blisters.  EXTREMITIES:  Positive petechiae all up and down the legs with no palpable  purpura.  NEUROLOGICAL:  The patient is awake, alert and oriented x3.  Cranial nerves  are intact with the exception of the strabismus.  Power 5/5.  Sensation  intact.  Plantars are downgoing.   LABORATORY DATA:  White count 6.4, hemoglobin 15, hematocrit 43.5, platelets  less than 5.  His sodium is 143, potassium 3.8, chloride 106, CO2 26, BUN  16, creatinine 1.4, glucose 101.  His smear is consistent with  thrombocytopenia.  His LFTs are within normal limits.  PT/INR is 14.1 and  1.1, his PTT is 28.   ASSESSMENT AND PLAN:  This is a 65 year old white male who has not seen a  physician in greater than 22 or so years presents with acute onset of blood  blisters in his mouth and prolonged  bleeding from a minor scrape to his  hand.  He had no fevers or chills.  His pictures appears to be consistent  with ITP.   1. ITP.  I agree with the initial IV steroids and then will change to 80      mg IV q.6h.  I will transfuse him to bring his platelets up to greater      than between 10 and 20.  We will limit his blood draws.  2. Rectal bleeding.  He is not actively bleeding on external examination      at this time but he is at risk for bleeding especially on steroids.  We      will start him on a stool softener and external cream or Tucks pads to      present straining and swelling.  He will also be  on a PPI.  3. Cardiovascular:  There is no history or symptoms, will do routine      monitoring at this time.  4. Pulmonary:  No hemoptysis or cough.  We will, however, check a CT of      the chest especially in light of the history of the thyroid cancer.  5. GI:  Protonix 40 mg p.o. b.i.d. while  on steroids.  6. GU:  He has had no hematuria but we will monitor that.  7. DVT prophylaxis at this time has to be with ambulation only as he is      not a candidate for Lovenox or STDs.   We will plan a formal consultation with hematology in the morning.  I did  review the case with the hematologist on call and will request formal  consultation if necessary in the a.m.      Melissa L. Ladona Ridgel, MD  Electronically Signed     MLT/MEDQ  D:  03/22/2006  T:  03/22/2006  Job:  [id number]

## 2010-12-03 NOTE — Discharge Summary (Signed)
NAME:  Derek Pham, Derek Pham NO.:  [id number]   MEDICAL RECORD NO.:  [id number]          PATIENT TYPE:  INP   LOCATION:  5030                         FACILITY:  [facility]   PHYSICIAN:  Georgann Housekeeper, MD      DATE OF BIRTH:  [birth year]   DATE OF ADMISSION:  03/22/2006  DATE OF DISCHARGE:  03/25/2006                                 DISCHARGE SUMMARY   DISCHARGE DIAGNOSES:  1. IDP acute.  2. Remote history of thyroid cancer.   MEDICATIONS ON DISCHARGE:  1. Prednisone 90 mg once a day.  2. Prilosec 20 mg once a day.  3. No aspirin.  4. No NSAIDs.   FOLLOWUP:  With Dr. Caron Presume, hematology/oncology in one week and follow with  me in 1 to 2 weeks.   CONSULTATION:  Hematology/oncology.   HOSPITAL COURSE:  Patient is 65 years old with no prior medical care, had  remote history of thyroid cancer with thyroidectomy 20 to 25 years ago at  [facility], has been in remission for many years.  Presented with the blood  blisters in the tongue and the lip and bleeding from gums and some  hemorrhoidal bleeding, found to have acute thrombocytopenia, a platelet  count less than 5000.  Other blood counts and blood chemistries were normal.  PT and PTT was normal.  Patient was ___________acute IDP.  Patient was  admitted, started on steroids IV and got platelet transfusion.  Patient did  not have any bleeding.  His platelet count improved, at the time of  discharge, was 38,000 went up to 48,000.  Patient was switched to the p.o.  prednisone.  Hematology/oncology consult agreed with the acute IDP and will  followup outpatient to taper the prednisone appropriately.  Because of his  remote history of thyroid cancer, he had a CT of the abdomen, pelvic and  chest, which came back completely negative for any malignancy or metastatic  disease.  There was a 9 mm thyroid nodule noted.  He had an ultrasound done,  which showed benign nodules and recommended outpatient followup.  His  thyroid function  tests was normal.  Remained hemodynamically stable.  Because of the steroids, his sugars were mildly elevated, A1c was normal.  He will follow with the diet for the low-sugar while he is on prednisone  high dose.  Patient was discharged and will follow up with  hematology/oncology.      Georgann Housekeeper, MD  Electronically Signed     KH/MEDQ  D:  05/02/2006  T:  05/02/2006  Job:  [id number]

## 2011-01-18 ENCOUNTER — Encounter (INDEPENDENT_AMBULATORY_CARE_PROVIDER_SITE_OTHER): Payer: Self-pay | Admitting: Surgery

## 2011-01-25 ENCOUNTER — Ambulatory Visit (INDEPENDENT_AMBULATORY_CARE_PROVIDER_SITE_OTHER): Payer: Commercial Indemnity | Admitting: Surgery

## 2011-01-25 ENCOUNTER — Encounter (INDEPENDENT_AMBULATORY_CARE_PROVIDER_SITE_OTHER): Payer: Self-pay | Admitting: Surgery

## 2011-01-25 DIAGNOSIS — Z9889 Other specified postprocedural states: Secondary | ICD-10-CM

## 2011-01-25 NOTE — Progress Notes (Signed)
Subjective:     Patient ID: Derek Pham, male   DOB: 10-13-1945, 65 y.o.   MRN: [id number]    There were no vitals taken for this visit.    HPI He is here for his first postoperative visit status post right inguinal hernia repair with mesh. He is doing well and has no complaints.  Review of Systems     Objective:   Physical Exam    On exam, his incision is healing well with mild swelling. There is no evidence of recurrence. Assessment:     Patient doing well status post right inguinal hernia repair with mesh    Plan:        He will refrain from heavy lifting for one more week. He may then resume normal activity. I will see him back as needed.

## 2012-11-28 DIAGNOSIS — D693 Immune thrombocytopenic purpura: Secondary | ICD-10-CM | POA: Diagnosis not present

## 2012-11-28 DIAGNOSIS — R7309 Other abnormal glucose: Secondary | ICD-10-CM | POA: Diagnosis not present

## 2012-11-28 DIAGNOSIS — Z0000 Encounter for general adult medical examination without abnormal findings: Secondary | ICD-10-CM | POA: Diagnosis not present

## 2012-11-28 DIAGNOSIS — N182 Chronic kidney disease, stage 2 (mild): Secondary | ICD-10-CM | POA: Diagnosis not present

## 2012-11-28 DIAGNOSIS — N400 Enlarged prostate without lower urinary tract symptoms: Secondary | ICD-10-CM | POA: Diagnosis not present

## 2012-11-28 DIAGNOSIS — E782 Mixed hyperlipidemia: Secondary | ICD-10-CM | POA: Diagnosis not present

## 2012-11-28 DIAGNOSIS — C73 Malignant neoplasm of thyroid gland: Secondary | ICD-10-CM | POA: Diagnosis not present

## 2013-03-07 DIAGNOSIS — Z23 Encounter for immunization: Secondary | ICD-10-CM | POA: Diagnosis not present

## 2013-06-21 DIAGNOSIS — H2510 Age-related nuclear cataract, unspecified eye: Secondary | ICD-10-CM | POA: Diagnosis not present

## 2013-11-29 DIAGNOSIS — R7309 Other abnormal glucose: Secondary | ICD-10-CM | POA: Diagnosis not present

## 2013-11-29 DIAGNOSIS — C73 Malignant neoplasm of thyroid gland: Secondary | ICD-10-CM | POA: Diagnosis not present

## 2013-11-29 DIAGNOSIS — Z0000 Encounter for general adult medical examination without abnormal findings: Secondary | ICD-10-CM | POA: Diagnosis not present

## 2013-11-29 DIAGNOSIS — Z23 Encounter for immunization: Secondary | ICD-10-CM | POA: Diagnosis not present

## 2013-11-29 DIAGNOSIS — N183 Chronic kidney disease, stage 3 unspecified: Secondary | ICD-10-CM | POA: Diagnosis not present

## 2013-11-29 DIAGNOSIS — N400 Enlarged prostate without lower urinary tract symptoms: Secondary | ICD-10-CM | POA: Diagnosis not present

## 2013-11-29 DIAGNOSIS — D693 Immune thrombocytopenic purpura: Secondary | ICD-10-CM | POA: Diagnosis not present

## 2013-11-29 DIAGNOSIS — E782 Mixed hyperlipidemia: Secondary | ICD-10-CM | POA: Diagnosis not present

## 2014-04-11 DIAGNOSIS — Z23 Encounter for immunization: Secondary | ICD-10-CM | POA: Diagnosis not present

## 2014-07-29 DIAGNOSIS — H2513 Age-related nuclear cataract, bilateral: Secondary | ICD-10-CM | POA: Diagnosis not present

## 2014-12-02 DIAGNOSIS — E782 Mixed hyperlipidemia: Secondary | ICD-10-CM | POA: Diagnosis not present

## 2014-12-02 DIAGNOSIS — C73 Malignant neoplasm of thyroid gland: Secondary | ICD-10-CM | POA: Diagnosis not present

## 2014-12-02 DIAGNOSIS — R7309 Other abnormal glucose: Secondary | ICD-10-CM | POA: Diagnosis not present

## 2014-12-02 DIAGNOSIS — Z0000 Encounter for general adult medical examination without abnormal findings: Secondary | ICD-10-CM | POA: Diagnosis not present

## 2014-12-02 DIAGNOSIS — N183 Chronic kidney disease, stage 3 (moderate): Secondary | ICD-10-CM | POA: Diagnosis not present

## 2014-12-02 DIAGNOSIS — N400 Enlarged prostate without lower urinary tract symptoms: Secondary | ICD-10-CM | POA: Diagnosis not present

## 2014-12-02 DIAGNOSIS — D693 Immune thrombocytopenic purpura: Secondary | ICD-10-CM | POA: Diagnosis not present

## 2014-12-02 DIAGNOSIS — N529 Male erectile dysfunction, unspecified: Secondary | ICD-10-CM | POA: Diagnosis not present

## 2014-12-02 DIAGNOSIS — Z1389 Encounter for screening for other disorder: Secondary | ICD-10-CM | POA: Diagnosis not present

## 2014-12-10 DIAGNOSIS — Z1211 Encounter for screening for malignant neoplasm of colon: Secondary | ICD-10-CM | POA: Diagnosis not present

## 2015-04-27 DIAGNOSIS — Z23 Encounter for immunization: Secondary | ICD-10-CM | POA: Diagnosis not present

## 2015-12-03 DIAGNOSIS — C73 Malignant neoplasm of thyroid gland: Secondary | ICD-10-CM | POA: Diagnosis not present

## 2015-12-03 DIAGNOSIS — Z0000 Encounter for general adult medical examination without abnormal findings: Secondary | ICD-10-CM | POA: Diagnosis not present

## 2015-12-03 DIAGNOSIS — D693 Immune thrombocytopenic purpura: Secondary | ICD-10-CM | POA: Diagnosis not present

## 2015-12-03 DIAGNOSIS — R7309 Other abnormal glucose: Secondary | ICD-10-CM | POA: Diagnosis not present

## 2015-12-03 DIAGNOSIS — Z7189 Other specified counseling: Secondary | ICD-10-CM | POA: Diagnosis not present

## 2015-12-03 DIAGNOSIS — N529 Male erectile dysfunction, unspecified: Secondary | ICD-10-CM | POA: Diagnosis not present

## 2015-12-03 DIAGNOSIS — N183 Chronic kidney disease, stage 3 (moderate): Secondary | ICD-10-CM | POA: Diagnosis not present

## 2015-12-03 DIAGNOSIS — Z1389 Encounter for screening for other disorder: Secondary | ICD-10-CM | POA: Diagnosis not present

## 2015-12-03 DIAGNOSIS — N400 Enlarged prostate without lower urinary tract symptoms: Secondary | ICD-10-CM | POA: Diagnosis not present

## 2016-03-25 DIAGNOSIS — Z23 Encounter for immunization: Secondary | ICD-10-CM | POA: Diagnosis not present

## 2016-12-02 DIAGNOSIS — N529 Male erectile dysfunction, unspecified: Secondary | ICD-10-CM | POA: Diagnosis not present

## 2016-12-02 DIAGNOSIS — Z1389 Encounter for screening for other disorder: Secondary | ICD-10-CM | POA: Diagnosis not present

## 2016-12-02 DIAGNOSIS — E782 Mixed hyperlipidemia: Secondary | ICD-10-CM | POA: Diagnosis not present

## 2016-12-02 DIAGNOSIS — Z0000 Encounter for general adult medical examination without abnormal findings: Secondary | ICD-10-CM | POA: Diagnosis not present

## 2016-12-02 DIAGNOSIS — N183 Chronic kidney disease, stage 3 (moderate): Secondary | ICD-10-CM | POA: Diagnosis not present

## 2016-12-02 DIAGNOSIS — C73 Malignant neoplasm of thyroid gland: Secondary | ICD-10-CM | POA: Diagnosis not present

## 2016-12-02 DIAGNOSIS — N400 Enlarged prostate without lower urinary tract symptoms: Secondary | ICD-10-CM | POA: Diagnosis not present

## 2016-12-02 DIAGNOSIS — R7309 Other abnormal glucose: Secondary | ICD-10-CM | POA: Diagnosis not present

## 2016-12-02 DIAGNOSIS — D693 Immune thrombocytopenic purpura: Secondary | ICD-10-CM | POA: Diagnosis not present

## 2017-02-16 DIAGNOSIS — H2513 Age-related nuclear cataract, bilateral: Secondary | ICD-10-CM | POA: Diagnosis not present

## 2017-03-24 DIAGNOSIS — Z23 Encounter for immunization: Secondary | ICD-10-CM | POA: Diagnosis not present

## 2017-12-08 DIAGNOSIS — Z0000 Encounter for general adult medical examination without abnormal findings: Secondary | ICD-10-CM | POA: Diagnosis not present

## 2017-12-08 DIAGNOSIS — N400 Enlarged prostate without lower urinary tract symptoms: Secondary | ICD-10-CM | POA: Diagnosis not present

## 2017-12-08 DIAGNOSIS — C73 Malignant neoplasm of thyroid gland: Secondary | ICD-10-CM | POA: Diagnosis not present

## 2017-12-08 DIAGNOSIS — Z23 Encounter for immunization: Secondary | ICD-10-CM | POA: Diagnosis not present

## 2017-12-08 DIAGNOSIS — R7309 Other abnormal glucose: Secondary | ICD-10-CM | POA: Diagnosis not present

## 2017-12-08 DIAGNOSIS — Z1211 Encounter for screening for malignant neoplasm of colon: Secondary | ICD-10-CM | POA: Diagnosis not present

## 2017-12-08 DIAGNOSIS — N183 Chronic kidney disease, stage 3 (moderate): Secondary | ICD-10-CM | POA: Diagnosis not present

## 2017-12-08 DIAGNOSIS — Z1159 Encounter for screening for other viral diseases: Secondary | ICD-10-CM | POA: Diagnosis not present

## 2017-12-08 DIAGNOSIS — Z1389 Encounter for screening for other disorder: Secondary | ICD-10-CM | POA: Diagnosis not present

## 2017-12-08 DIAGNOSIS — D693 Immune thrombocytopenic purpura: Secondary | ICD-10-CM | POA: Diagnosis not present

## 2017-12-08 DIAGNOSIS — E782 Mixed hyperlipidemia: Secondary | ICD-10-CM | POA: Diagnosis not present

## 2018-01-31 DIAGNOSIS — K5730 Diverticulosis of large intestine without perforation or abscess without bleeding: Secondary | ICD-10-CM | POA: Diagnosis not present

## 2018-01-31 DIAGNOSIS — D126 Benign neoplasm of colon, unspecified: Secondary | ICD-10-CM | POA: Diagnosis not present

## 2018-01-31 DIAGNOSIS — K635 Polyp of colon: Secondary | ICD-10-CM | POA: Diagnosis not present

## 2018-01-31 DIAGNOSIS — K648 Other hemorrhoids: Secondary | ICD-10-CM | POA: Diagnosis not present

## 2018-01-31 DIAGNOSIS — K5289 Other specified noninfective gastroenteritis and colitis: Secondary | ICD-10-CM | POA: Diagnosis not present

## 2018-01-31 DIAGNOSIS — Z1211 Encounter for screening for malignant neoplasm of colon: Secondary | ICD-10-CM | POA: Diagnosis not present

## 2018-02-02 DIAGNOSIS — Z1211 Encounter for screening for malignant neoplasm of colon: Secondary | ICD-10-CM | POA: Diagnosis not present

## 2018-02-02 DIAGNOSIS — D126 Benign neoplasm of colon, unspecified: Secondary | ICD-10-CM | POA: Diagnosis not present

## 2018-02-02 DIAGNOSIS — K5289 Other specified noninfective gastroenteritis and colitis: Secondary | ICD-10-CM | POA: Diagnosis not present

## 2018-03-11 DIAGNOSIS — Z23 Encounter for immunization: Secondary | ICD-10-CM | POA: Diagnosis not present

## 2018-06-11 DIAGNOSIS — H5212 Myopia, left eye: Secondary | ICD-10-CM | POA: Diagnosis not present

## 2018-06-11 DIAGNOSIS — H5201 Hypermetropia, right eye: Secondary | ICD-10-CM | POA: Diagnosis not present

## 2018-06-11 DIAGNOSIS — H2513 Age-related nuclear cataract, bilateral: Secondary | ICD-10-CM | POA: Diagnosis not present

## 2018-06-11 DIAGNOSIS — H524 Presbyopia: Secondary | ICD-10-CM | POA: Diagnosis not present

## 2018-06-11 DIAGNOSIS — H52223 Regular astigmatism, bilateral: Secondary | ICD-10-CM | POA: Diagnosis not present

## 2018-06-11 DIAGNOSIS — H5015 Alternating exotropia: Secondary | ICD-10-CM | POA: Diagnosis not present

## 2018-07-30 DIAGNOSIS — K649 Unspecified hemorrhoids: Secondary | ICD-10-CM | POA: Diagnosis not present

## 2018-12-17 DIAGNOSIS — C73 Malignant neoplasm of thyroid gland: Secondary | ICD-10-CM | POA: Diagnosis not present

## 2018-12-17 DIAGNOSIS — R21 Rash and other nonspecific skin eruption: Secondary | ICD-10-CM | POA: Diagnosis not present

## 2018-12-17 DIAGNOSIS — N529 Male erectile dysfunction, unspecified: Secondary | ICD-10-CM | POA: Diagnosis not present

## 2018-12-17 DIAGNOSIS — Z0000 Encounter for general adult medical examination without abnormal findings: Secondary | ICD-10-CM | POA: Diagnosis not present

## 2018-12-17 DIAGNOSIS — N183 Chronic kidney disease, stage 3 (moderate): Secondary | ICD-10-CM | POA: Diagnosis not present

## 2018-12-17 DIAGNOSIS — Z1389 Encounter for screening for other disorder: Secondary | ICD-10-CM | POA: Diagnosis not present

## 2018-12-17 DIAGNOSIS — E782 Mixed hyperlipidemia: Secondary | ICD-10-CM | POA: Diagnosis not present

## 2018-12-17 DIAGNOSIS — R7309 Other abnormal glucose: Secondary | ICD-10-CM | POA: Diagnosis not present

## 2018-12-17 DIAGNOSIS — N400 Enlarged prostate without lower urinary tract symptoms: Secondary | ICD-10-CM | POA: Diagnosis not present

## 2018-12-17 DIAGNOSIS — D693 Immune thrombocytopenic purpura: Secondary | ICD-10-CM | POA: Diagnosis not present

## 2019-09-01 ENCOUNTER — Ambulatory Visit: Payer: Medicare Other | Attending: Internal Medicine

## 2019-09-01 DIAGNOSIS — Z23 Encounter for immunization: Secondary | ICD-10-CM | POA: Insufficient documentation

## 2019-09-01 NOTE — Progress Notes (Signed)
   Covid-19 Vaccination Clinic  Name:  Derek Pham    MRN: [id number] DOB: 12/07/1945  09/01/2019  [name] was observed post Covid-19 immunization for 15 minutes without incidence. He was provided with Vaccine Information Sheet and instruction to access the V-Safe system.   [name] was instructed to call 911 with any severe reactions post vaccine: Marland Kitchen Difficulty breathing  . Swelling of your face and throat  . A fast heartbeat  . A bad rash all over your body  . Dizziness and weakness    Immunizations Administered    Name Date Dose VIS Date Route   Pfizer COVID-19 Vaccine 09/01/2019  9:17 AM 0.3 mL 06/28/2019 Intramuscular   Manufacturer: [place]   Lot: [id number]   [place]: [id number]

## 2019-09-23 ENCOUNTER — Ambulatory Visit: Payer: Medicare Other | Attending: Internal Medicine

## 2019-09-23 DIAGNOSIS — Z23 Encounter for immunization: Secondary | ICD-10-CM | POA: Insufficient documentation

## 2019-09-23 NOTE — Progress Notes (Signed)
   Covid-19 Vaccination Clinic  Name:  Derek Pham    MRN: [id number] DOB: [birth year]  09/23/2019  [name] was observed post Covid-19 immunization for 15 minutes without incident. He was provided with Vaccine Information Sheet and instruction to access the V-Safe system.   [name] was instructed to call 911 with any severe reactions post vaccine: Marland Kitchen Difficulty breathing  . Swelling of face and throat  . A fast heartbeat  . A bad rash all over body  . Dizziness and weakness   Immunizations Administered    Name Date Dose VIS Date Route   Pfizer COVID-19 Vaccine 09/23/2019  5:45 PM 0.3 mL 06/28/2019 Intramuscular   Manufacturer: [place]   Lot: [id number]   [place]: [id number]

## 2020-01-08 DIAGNOSIS — R7309 Other abnormal glucose: Secondary | ICD-10-CM | POA: Diagnosis not present

## 2020-01-08 DIAGNOSIS — Z1389 Encounter for screening for other disorder: Secondary | ICD-10-CM | POA: Diagnosis not present

## 2020-01-08 DIAGNOSIS — Z0000 Encounter for general adult medical examination without abnormal findings: Secondary | ICD-10-CM | POA: Diagnosis not present

## 2020-01-08 DIAGNOSIS — N400 Enlarged prostate without lower urinary tract symptoms: Secondary | ICD-10-CM | POA: Diagnosis not present

## 2020-01-08 DIAGNOSIS — N183 Chronic kidney disease, stage 3 unspecified: Secondary | ICD-10-CM | POA: Diagnosis not present

## 2020-01-08 DIAGNOSIS — D693 Immune thrombocytopenic purpura: Secondary | ICD-10-CM | POA: Diagnosis not present

## 2020-01-08 DIAGNOSIS — C73 Malignant neoplasm of thyroid gland: Secondary | ICD-10-CM | POA: Diagnosis not present

## 2020-01-08 DIAGNOSIS — E782 Mixed hyperlipidemia: Secondary | ICD-10-CM | POA: Diagnosis not present

## 2020-04-15 DIAGNOSIS — Z23 Encounter for immunization: Secondary | ICD-10-CM | POA: Diagnosis not present

## 2020-10-14 DIAGNOSIS — H2513 Age-related nuclear cataract, bilateral: Secondary | ICD-10-CM | POA: Diagnosis not present

## 2020-11-30 DIAGNOSIS — T63481A Toxic effect of venom of other arthropod, accidental (unintentional), initial encounter: Secondary | ICD-10-CM | POA: Diagnosis not present

## 2020-11-30 DIAGNOSIS — L03116 Cellulitis of left lower limb: Secondary | ICD-10-CM | POA: Diagnosis not present

## 2021-01-11 DIAGNOSIS — Z1389 Encounter for screening for other disorder: Secondary | ICD-10-CM | POA: Diagnosis not present

## 2021-01-11 DIAGNOSIS — N183 Chronic kidney disease, stage 3 unspecified: Secondary | ICD-10-CM | POA: Diagnosis not present

## 2021-01-11 DIAGNOSIS — D693 Immune thrombocytopenic purpura: Secondary | ICD-10-CM | POA: Diagnosis not present

## 2021-01-11 DIAGNOSIS — E782 Mixed hyperlipidemia: Secondary | ICD-10-CM | POA: Diagnosis not present

## 2021-01-11 DIAGNOSIS — Z0000 Encounter for general adult medical examination without abnormal findings: Secondary | ICD-10-CM | POA: Diagnosis not present

## 2021-01-11 DIAGNOSIS — N400 Enlarged prostate without lower urinary tract symptoms: Secondary | ICD-10-CM | POA: Diagnosis not present

## 2021-01-11 DIAGNOSIS — R7309 Other abnormal glucose: Secondary | ICD-10-CM | POA: Diagnosis not present

## 2021-01-11 DIAGNOSIS — C73 Malignant neoplasm of thyroid gland: Secondary | ICD-10-CM | POA: Diagnosis not present

## 2021-01-11 DIAGNOSIS — R21 Rash and other nonspecific skin eruption: Secondary | ICD-10-CM | POA: Diagnosis not present

## 2021-03-24 DIAGNOSIS — Z23 Encounter for immunization: Secondary | ICD-10-CM | POA: Diagnosis not present

## 2021-03-30 DIAGNOSIS — R350 Frequency of micturition: Secondary | ICD-10-CM | POA: Diagnosis not present

## 2021-03-30 DIAGNOSIS — N400 Enlarged prostate without lower urinary tract symptoms: Secondary | ICD-10-CM | POA: Diagnosis not present

## 2021-11-02 DIAGNOSIS — H2513 Age-related nuclear cataract, bilateral: Secondary | ICD-10-CM | POA: Diagnosis not present

## 2021-11-26 DIAGNOSIS — Z23 Encounter for immunization: Secondary | ICD-10-CM | POA: Diagnosis not present

## 2022-01-12 DIAGNOSIS — D693 Immune thrombocytopenic purpura: Secondary | ICD-10-CM | POA: Diagnosis not present

## 2022-01-12 DIAGNOSIS — Z85850 Personal history of malignant neoplasm of thyroid: Secondary | ICD-10-CM | POA: Diagnosis not present

## 2022-01-12 DIAGNOSIS — N183 Chronic kidney disease, stage 3 unspecified: Secondary | ICD-10-CM | POA: Diagnosis not present

## 2022-01-12 DIAGNOSIS — R7309 Other abnormal glucose: Secondary | ICD-10-CM | POA: Diagnosis not present

## 2022-01-12 DIAGNOSIS — Z0000 Encounter for general adult medical examination without abnormal findings: Secondary | ICD-10-CM | POA: Diagnosis not present

## 2022-01-12 DIAGNOSIS — E782 Mixed hyperlipidemia: Secondary | ICD-10-CM | POA: Diagnosis not present

## 2022-01-12 DIAGNOSIS — Z1389 Encounter for screening for other disorder: Secondary | ICD-10-CM | POA: Diagnosis not present

## 2022-01-12 DIAGNOSIS — N400 Enlarged prostate without lower urinary tract symptoms: Secondary | ICD-10-CM | POA: Diagnosis not present

## 2022-04-11 DIAGNOSIS — Z23 Encounter for immunization: Secondary | ICD-10-CM | POA: Diagnosis not present

## 2023-01-16 DIAGNOSIS — N1831 Chronic kidney disease, stage 3a: Secondary | ICD-10-CM | POA: Diagnosis not present

## 2023-01-16 DIAGNOSIS — Z1331 Encounter for screening for depression: Secondary | ICD-10-CM | POA: Diagnosis not present

## 2023-01-16 DIAGNOSIS — Z0000 Encounter for general adult medical examination without abnormal findings: Secondary | ICD-10-CM | POA: Diagnosis not present

## 2023-01-16 DIAGNOSIS — E782 Mixed hyperlipidemia: Secondary | ICD-10-CM | POA: Diagnosis not present

## 2023-01-16 DIAGNOSIS — M79606 Pain in leg, unspecified: Secondary | ICD-10-CM | POA: Diagnosis not present

## 2023-01-16 DIAGNOSIS — Z85850 Personal history of malignant neoplasm of thyroid: Secondary | ICD-10-CM | POA: Diagnosis not present

## 2023-01-16 DIAGNOSIS — N400 Enlarged prostate without lower urinary tract symptoms: Secondary | ICD-10-CM | POA: Diagnosis not present

## 2023-01-16 DIAGNOSIS — D693 Immune thrombocytopenic purpura: Secondary | ICD-10-CM | POA: Diagnosis not present

## 2023-01-16 DIAGNOSIS — Z23 Encounter for immunization: Secondary | ICD-10-CM | POA: Diagnosis not present

## 2023-01-18 DIAGNOSIS — H5212 Myopia, left eye: Secondary | ICD-10-CM | POA: Diagnosis not present

## 2023-01-18 DIAGNOSIS — H2513 Age-related nuclear cataract, bilateral: Secondary | ICD-10-CM | POA: Diagnosis not present

## 2023-05-08 DIAGNOSIS — Z09 Encounter for follow-up examination after completed treatment for conditions other than malignant neoplasm: Secondary | ICD-10-CM | POA: Diagnosis not present

## 2023-05-08 DIAGNOSIS — D122 Benign neoplasm of ascending colon: Secondary | ICD-10-CM | POA: Diagnosis not present

## 2023-05-08 DIAGNOSIS — K5730 Diverticulosis of large intestine without perforation or abscess without bleeding: Secondary | ICD-10-CM | POA: Diagnosis not present

## 2023-05-08 DIAGNOSIS — Z860101 Personal history of adenomatous and serrated colon polyps: Secondary | ICD-10-CM | POA: Diagnosis not present

## 2023-05-10 DIAGNOSIS — D122 Benign neoplasm of ascending colon: Secondary | ICD-10-CM | POA: Diagnosis not present

## 2023-09-11 DIAGNOSIS — N5089 Other specified disorders of the male genital organs: Secondary | ICD-10-CM | POA: Diagnosis not present

## 2023-09-11 DIAGNOSIS — B356 Tinea cruris: Secondary | ICD-10-CM | POA: Diagnosis not present

## 2023-10-09 DIAGNOSIS — N1831 Chronic kidney disease, stage 3a: Secondary | ICD-10-CM | POA: Diagnosis not present

## 2023-10-16 DIAGNOSIS — N400 Enlarged prostate without lower urinary tract symptoms: Secondary | ICD-10-CM | POA: Diagnosis not present

## 2023-10-16 DIAGNOSIS — N1831 Chronic kidney disease, stage 3a: Secondary | ICD-10-CM | POA: Diagnosis not present

## 2023-10-16 DIAGNOSIS — E782 Mixed hyperlipidemia: Secondary | ICD-10-CM | POA: Diagnosis not present

## 2023-10-27 ENCOUNTER — Other Ambulatory Visit: Payer: Self-pay | Admitting: Oral Surgery

## 2023-10-27 DIAGNOSIS — K061 Gingival enlargement: Secondary | ICD-10-CM | POA: Diagnosis not present

## 2023-10-31 LAB — SURGICAL PATHOLOGY

## 2023-11-07 DIAGNOSIS — N1831 Chronic kidney disease, stage 3a: Secondary | ICD-10-CM | POA: Diagnosis not present

## 2023-11-15 DIAGNOSIS — N1831 Chronic kidney disease, stage 3a: Secondary | ICD-10-CM | POA: Diagnosis not present

## 2023-11-15 DIAGNOSIS — E782 Mixed hyperlipidemia: Secondary | ICD-10-CM | POA: Diagnosis not present

## 2023-11-15 DIAGNOSIS — N400 Enlarged prostate without lower urinary tract symptoms: Secondary | ICD-10-CM | POA: Diagnosis not present

## 2023-12-07 DIAGNOSIS — N1831 Chronic kidney disease, stage 3a: Secondary | ICD-10-CM | POA: Diagnosis not present

## 2023-12-16 DIAGNOSIS — N400 Enlarged prostate without lower urinary tract symptoms: Secondary | ICD-10-CM | POA: Diagnosis not present

## 2023-12-16 DIAGNOSIS — E782 Mixed hyperlipidemia: Secondary | ICD-10-CM | POA: Diagnosis not present

## 2023-12-16 DIAGNOSIS — N1831 Chronic kidney disease, stage 3a: Secondary | ICD-10-CM | POA: Diagnosis not present

## 2024-01-06 DIAGNOSIS — N1831 Chronic kidney disease, stage 3a: Secondary | ICD-10-CM | POA: Diagnosis not present

## 2024-01-15 DIAGNOSIS — N1831 Chronic kidney disease, stage 3a: Secondary | ICD-10-CM | POA: Diagnosis not present

## 2024-01-15 DIAGNOSIS — E782 Mixed hyperlipidemia: Secondary | ICD-10-CM | POA: Diagnosis not present

## 2024-01-15 DIAGNOSIS — N400 Enlarged prostate without lower urinary tract symptoms: Secondary | ICD-10-CM | POA: Diagnosis not present

## 2024-01-17 DIAGNOSIS — Z1331 Encounter for screening for depression: Secondary | ICD-10-CM | POA: Diagnosis not present

## 2024-01-17 DIAGNOSIS — E782 Mixed hyperlipidemia: Secondary | ICD-10-CM | POA: Diagnosis not present

## 2024-01-17 DIAGNOSIS — R7309 Other abnormal glucose: Secondary | ICD-10-CM | POA: Diagnosis not present

## 2024-01-17 DIAGNOSIS — R030 Elevated blood-pressure reading, without diagnosis of hypertension: Secondary | ICD-10-CM | POA: Diagnosis not present

## 2024-01-17 DIAGNOSIS — N1831 Chronic kidney disease, stage 3a: Secondary | ICD-10-CM | POA: Diagnosis not present

## 2024-01-17 DIAGNOSIS — Z0000 Encounter for general adult medical examination without abnormal findings: Secondary | ICD-10-CM | POA: Diagnosis not present

## 2024-01-17 DIAGNOSIS — N400 Enlarged prostate without lower urinary tract symptoms: Secondary | ICD-10-CM | POA: Diagnosis not present

## 2024-01-17 DIAGNOSIS — Z85850 Personal history of malignant neoplasm of thyroid: Secondary | ICD-10-CM | POA: Diagnosis not present

## 2024-01-17 DIAGNOSIS — D693 Immune thrombocytopenic purpura: Secondary | ICD-10-CM | POA: Diagnosis not present

## 2024-01-31 DIAGNOSIS — H43812 Vitreous degeneration, left eye: Secondary | ICD-10-CM | POA: Diagnosis not present

## 2024-02-05 DIAGNOSIS — N1831 Chronic kidney disease, stage 3a: Secondary | ICD-10-CM | POA: Diagnosis not present

## 2024-02-15 DIAGNOSIS — E782 Mixed hyperlipidemia: Secondary | ICD-10-CM | POA: Diagnosis not present

## 2024-02-15 DIAGNOSIS — N400 Enlarged prostate without lower urinary tract symptoms: Secondary | ICD-10-CM | POA: Diagnosis not present

## 2024-02-15 DIAGNOSIS — N1831 Chronic kidney disease, stage 3a: Secondary | ICD-10-CM | POA: Diagnosis not present

## 2024-03-06 DIAGNOSIS — N1831 Chronic kidney disease, stage 3a: Secondary | ICD-10-CM | POA: Diagnosis not present

## 2024-03-17 DIAGNOSIS — N1831 Chronic kidney disease, stage 3a: Secondary | ICD-10-CM | POA: Diagnosis not present

## 2024-03-17 DIAGNOSIS — E782 Mixed hyperlipidemia: Secondary | ICD-10-CM | POA: Diagnosis not present

## 2024-03-17 DIAGNOSIS — N400 Enlarged prostate without lower urinary tract symptoms: Secondary | ICD-10-CM | POA: Diagnosis not present

## 2024-04-05 DIAGNOSIS — N1831 Chronic kidney disease, stage 3a: Secondary | ICD-10-CM | POA: Diagnosis not present

## 2024-04-16 DIAGNOSIS — N1831 Chronic kidney disease, stage 3a: Secondary | ICD-10-CM | POA: Diagnosis not present

## 2024-04-16 DIAGNOSIS — E782 Mixed hyperlipidemia: Secondary | ICD-10-CM | POA: Diagnosis not present

## 2024-04-16 DIAGNOSIS — N400 Enlarged prostate without lower urinary tract symptoms: Secondary | ICD-10-CM | POA: Diagnosis not present

## 2024-04-19 DIAGNOSIS — Z23 Encounter for immunization: Secondary | ICD-10-CM | POA: Diagnosis not present

## 2024-05-05 DIAGNOSIS — N1831 Chronic kidney disease, stage 3a: Secondary | ICD-10-CM | POA: Diagnosis not present

## 2024-05-17 DIAGNOSIS — N400 Enlarged prostate without lower urinary tract symptoms: Secondary | ICD-10-CM | POA: Diagnosis not present

## 2024-05-17 DIAGNOSIS — E782 Mixed hyperlipidemia: Secondary | ICD-10-CM | POA: Diagnosis not present

## 2024-05-17 DIAGNOSIS — N1831 Chronic kidney disease, stage 3a: Secondary | ICD-10-CM | POA: Diagnosis not present

## 2024-06-04 DIAGNOSIS — N1831 Chronic kidney disease, stage 3a: Secondary | ICD-10-CM | POA: Diagnosis not present

## 2024-06-16 DIAGNOSIS — E782 Mixed hyperlipidemia: Secondary | ICD-10-CM | POA: Diagnosis not present

## 2024-06-16 DIAGNOSIS — N1831 Chronic kidney disease, stage 3a: Secondary | ICD-10-CM | POA: Diagnosis not present

## 2024-06-16 DIAGNOSIS — N400 Enlarged prostate without lower urinary tract symptoms: Secondary | ICD-10-CM | POA: Diagnosis not present
